# Patient Record
Sex: Female | Born: 2000 | Race: Black or African American | Hispanic: No | Marital: Single
Health system: Southern US, Community
[De-identification: ages and names within clinical notes are randomized; demographics above are authoritative.]

## PROBLEM LIST (undated history)

## (undated) DIAGNOSIS — N921 Excessive and frequent menstruation with irregular cycle: Secondary | ICD-10-CM

## (undated) DIAGNOSIS — Z9889 Other specified postprocedural states: Secondary | ICD-10-CM

---
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
## 2020-07-06 ENCOUNTER — Emergency Department (HOSPITAL_BASED_OUTPATIENT_CLINIC_OR_DEPARTMENT_OTHER): Payer: Medicaid - Out of State

## 2020-07-06 ENCOUNTER — Other Ambulatory Visit: Payer: Self-pay

## 2020-07-06 ENCOUNTER — Emergency Department (HOSPITAL_BASED_OUTPATIENT_CLINIC_OR_DEPARTMENT_OTHER)
Admission: EM | Admit: 2020-07-06 | Discharge: 2020-07-06 | Disposition: A | Discharge status: Home / Self Care | Payer: Medicaid - Out of State | Attending: Emergency Medicine | Admitting: Emergency Medicine

## 2020-07-06 DIAGNOSIS — R059 Cough, unspecified: Secondary | ICD-10-CM

## 2020-07-06 DIAGNOSIS — U071 COVID-19: Secondary | ICD-10-CM | POA: Diagnosis not present

## 2020-07-06 LAB — SARS CORONAVIRUS 2 (TAT 6-24 HRS): SARS Coronavirus 2: POSITIVE — AB

## 2020-07-06 MED ORDER — ACETAMINOPHEN 500 MG PO TABS
1000.0000 mg | ORAL_TABLET | Freq: Once | ORAL | Status: AC
  Administered 2020-07-06: 1000 mg via ORAL
  Filled 2020-07-06: qty 2

## 2020-07-06 NOTE — Discharge Instructions (Signed)
Chest x-ray showed no evidence of pneumonia or infection.  Overall suspect you have ongoing viral process.  Continue Tylenol and Motrin as needed for fever.  Please return if symptoms worsen.  Follow-up your COVID and influenza testing online.

## 2020-07-06 NOTE — ED Provider Notes (Signed)
[facility] EMERGENCY DEPARTMENT Provider Note   CSN: [id number] Arrival date & time: 07/06/20  1001     History Chief Complaint  Patient presents with  . Cough    Julie Nunez is a 20 y.o. female.  The history is provided by the patient.  Cough Cough characteristics:  Productive Sputum characteristics:  Nondescript Severity:  Mild Onset quality:  Gradual Duration:  2 weeks Timing:  Intermittent Progression:  Waxing and waning Chronicity:  New Relieved by:  Nothing Worsened by:  Nothing Associated symptoms: no chest pain, no chills, no ear pain, no fever, no rash, no shortness of breath and no sore throat        No past medical history on file.  There are no problems to display for this patient.    OB History   No obstetric history on file.     No family history on file.     Home Medications Prior to Admission medications   Medication Sig Start Date End Date Taking? Authorizing Provider  cetirizine (ZYRTEC) 10 MG tablet Take 10 mg by mouth daily.   Yes [provider]  fluticasone (FLONASE) 50 MCG/ACT nasal spray Place 1 spray into both nostrils daily.   Yes [provider]  medroxyPROGESTERone (DEPO-PROVERA) 150 MG/ML injection Inject 150 mg into the muscle every 3 (three) months.   Yes [provider]    Allergies    Patient has no known allergies.  Review of Systems   Review of Systems  Constitutional: Negative for chills and fever.  HENT: Negative for ear pain and sore throat.   Eyes: Negative for pain and visual disturbance.  Respiratory: Positive for cough. Negative for shortness of breath.   Cardiovascular: Negative for chest pain and palpitations.  Gastrointestinal: Negative for abdominal pain and vomiting.  Genitourinary: Negative for dysuria and hematuria.  Musculoskeletal: Negative for arthralgias and back pain.  Skin: Negative for color change and rash.  Neurological: Negative for seizures and  syncope.  All other systems reviewed and are negative.   Physical Exam Updated Vital Signs BP 123/78 (BP Location: Right Arm)   Pulse (!) 118   Temp 99.7 F (37.6 C) (Oral)   Resp 20   Ht 5\' 6"  (1.676 m)   Wt 89.8 kg   LMP 06/10/2020 (Exact Date)   SpO2 99%   BMI 31.96 kg/m   Physical Exam Vitals and nursing note reviewed.  Constitutional:      General: She is not in acute distress.    Appearance: She is well-developed. She is not ill-appearing.  HENT:     Head: Normocephalic and atraumatic.     Nose: Nose normal.     Mouth/Throat:     Mouth: Mucous membranes are moist.  Eyes:     Extraocular Movements: Extraocular movements intact.     Conjunctiva/sclera: Conjunctivae normal.     Pupils: Pupils are equal, round, and reactive to light.  Cardiovascular:     Rate and Rhythm: Normal rate and regular rhythm.     Pulses: Normal pulses.     Heart sounds: Normal heart sounds. No murmur heard.   Pulmonary:     Effort: Pulmonary effort is normal. No respiratory distress.     Breath sounds: Normal breath sounds.  Abdominal:     Palpations: Abdomen is soft.     Tenderness: There is no abdominal tenderness.  Musculoskeletal:     Cervical back: Normal range of motion and neck supple.  Skin:    General:  Skin is warm and dry.  Neurological:     Mental Status: She is alert.     ED Results / Procedures / Treatments   Labs (all labs ordered are listed, but only abnormal results are displayed) Labs Reviewed  SARS CORONAVIRUS 2 (TAT 6-24 HRS)    EKG None  Radiology DG Chest Portable 1 View  Result Date: 07/06/2020 CLINICAL DATA:  Cough. EXAM: PORTABLE CHEST 1 VIEW COMPARISON:  None. FINDINGS: The heart size and mediastinal contours are within normal limits. Both lungs are clear. The visualized skeletal structures are unremarkable. IMPRESSION: No active disease. Electronically Signed   By: Lupita Raider M.D.   On: 07/06/2020 10:59    Procedures Procedures    Medications Ordered in ED Medications  acetaminophen (TYLENOL) tablet 1,000 mg (1,000 mg Oral Given 07/06/20 1056)    ED Course  I have reviewed the triage vital signs and the nursing notes.  Pertinent labs & imaging results that were available during my care of the patient were reviewed by me and considered in my medical decision making (see chart for details).    MDM Rules/Calculators/A&P                          Julie Nunez is here with cough.  Overall unremarkable vitals.  Low-grade fever.  Mild tachycardia.  Has had a cough with some sputum production for the last week plus.  Had negative flu test 2 weeks ago.  Chest x-ray shows no evidence of pneumonia.  She has no signs of respiratory distress.  No hypoxia.  No concern for blood clot.  No concern for cardiac process.  Overall suspect viral process.  Will retest for COVID and influenza.  Discharged in good condition.  Understands return precautions.  This chart was dictated using voice recognition software.  Despite best efforts to proofread,  errors can occur which can change the documentation meaning.     Final Clinical Impression(s) / ED Diagnoses Final diagnoses:  Cough    Rx / DC Orders ED Discharge Orders    None       Virgina Norfolk, DO 07/06/20 1104

## 2020-07-06 NOTE — ED Triage Notes (Signed)
Persistent productive cough x2 weeks.

## 2020-07-11 ENCOUNTER — Emergency Department (HOSPITAL_BASED_OUTPATIENT_CLINIC_OR_DEPARTMENT_OTHER): Payer: Medicaid - Out of State

## 2020-07-11 ENCOUNTER — Emergency Department (HOSPITAL_BASED_OUTPATIENT_CLINIC_OR_DEPARTMENT_OTHER)
Admission: EM | Admit: 2020-07-11 | Discharge: 2020-07-11 | Disposition: A | Discharge status: Home / Self Care | Payer: Medicaid - Out of State | Attending: Emergency Medicine | Admitting: Emergency Medicine

## 2020-07-11 ENCOUNTER — Other Ambulatory Visit: Payer: Self-pay

## 2020-07-11 DIAGNOSIS — U071 COVID-19: Secondary | ICD-10-CM | POA: Diagnosis not present

## 2020-07-11 DIAGNOSIS — R059 Cough, unspecified: Secondary | ICD-10-CM | POA: Diagnosis present

## 2020-07-11 NOTE — ED Provider Notes (Signed)
[facility] EMERGENCY DEPARTMENT Provider Note   CSN: [id number] Arrival date & time: 07/11/20  1308     History Chief Complaint  Patient presents with  . 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 female.  HPI      Tested positive for COVID 4/29 Cough 2-3 weeks, congestion Coughing up yellow-clear mucus Chest pain, like pressure type feeling just when coughing, cough worse at night No shortness of breath No orthopnea No leg pain or swelling No hx of DVT/PE, is on ocp, no long trips car airplane, or recent surgeries No fever Here looking for work guidance given continued symptoms and when to return to work.  No past medical history on file.  There are no problems to display for this patient.    OB History   No obstetric history on file.     No family history on file.     Home Medications Prior to Admission medications   Medication Sig Start Date End Date Taking? Authorizing Provider  cetirizine (ZYRTEC) 10 MG tablet Take 10 mg by mouth daily.    [provider]  fluticasone (FLONASE) 50 MCG/ACT nasal spray Place 1 spray into both nostrils daily.    [provider]  medroxyPROGESTERone (DEPO-PROVERA) 150 MG/ML injection Inject 150 mg into the muscle every 3 (three) months.    [provider]    Allergies    Patient has no known allergies.  Review of Systems   Review of Systems  Constitutional: Positive for fatigue. Negative for fever.  HENT: Positive for congestion. Negative for sore throat.   Respiratory: Positive for cough. Negative for shortness of breath.   Cardiovascular: Positive for chest pain (with cough). Negative for leg swelling.  Gastrointestinal: Negative for abdominal pain, diarrhea, nausea and vomiting.  Musculoskeletal: Negative for back pain.  Skin: Negative for rash.  Neurological: Positive for headaches. Negative for light-headedness.     Physical Exam Updated Vital Signs BP 109/89 (BP Location: Right Arm)   Pulse 80   Temp 98.9 F (37.2 C) (Oral)   Resp 20   Ht 5\' 6"  (1.676 m)   Wt 89.8 kg   SpO2 99%   BMI 31.96 kg/m   Physical Exam Vitals and nursing note reviewed.  Constitutional:      General: She is not in acute distress.    Appearance: She is well-developed. She is not diaphoretic.  HENT:     Head: Normocephalic and atraumatic.  Eyes:     Conjunctiva/sclera: Conjunctivae normal.  Neck:     Comments: No JVD Cardiovascular:     Rate and Rhythm: Normal rate and regular rhythm.     Heart sounds: Normal heart sounds. No murmur heard. No friction rub. No gallop.   Pulmonary:     Effort: Pulmonary effort is normal. No respiratory distress.     Breath sounds: Normal breath sounds. No wheezing or rales.  Abdominal:     General: There is no distension.     Palpations: Abdomen is soft.     Tenderness: There is no abdominal tenderness. There is no guarding.  Musculoskeletal:        General: No tenderness.     Cervical back: Normal range of motion.  Skin:    General: Skin is warm and dry.     Findings: No erythema or rash.  Neurological:     Mental Status: She is alert and oriented to  person, place, and time.     ED Results / Procedures / Treatments   Labs (all labs ordered are listed, but only abnormal results are displayed) Labs Reviewed - No data to display  EKG None  Radiology No results found.  Procedures Procedures   Medications Ordered in ED Medications - No data to display  ED Course  I have reviewed the triage vital signs and the nursing notes.  Pertinent labs & imaging results that were available during my care of the patient were reviewed by me and considered in my medical decision making (see chart for details).    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  Cough has been present for approximately 2 weeks. No dyspnea, no asymmetric leg swelling, no tachycardia, no hypoxia or tachypnea and have low clinical suspicion for PE. Fever resolving days ago from initial infectious symptoms without new fever or worsening cough, no abnormalities on exam, and have low suspicion at this time for bacterial pneumonia. No findings on exam to suggest CHF.  Suspect symptoms are continuing cough from COVID 19 infection. Given continued symptoms and positive test only 5 days ago, recommend continued supportive care and quarantine.   Final Clinical Impression(s) / ED Diagnoses Final diagnoses:  COVID-19    Rx / DC Orders ED Discharge Orders    None       Alvira Monday, MD 07/12/20 [phone]

## 2020-07-11 NOTE — ED Triage Notes (Signed)
Julie Nunez wants to be retested to see if she is COVID negative.  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

## 2021-11-09 IMAGING — DX DG CHEST 1V PORT
1 series · 1 of 1 positions shown · non-contrast
Comparison: None.

CLINICAL DATA: Cough.

EXAM:
PORTABLE CHEST 1 VIEW

[chest ap]
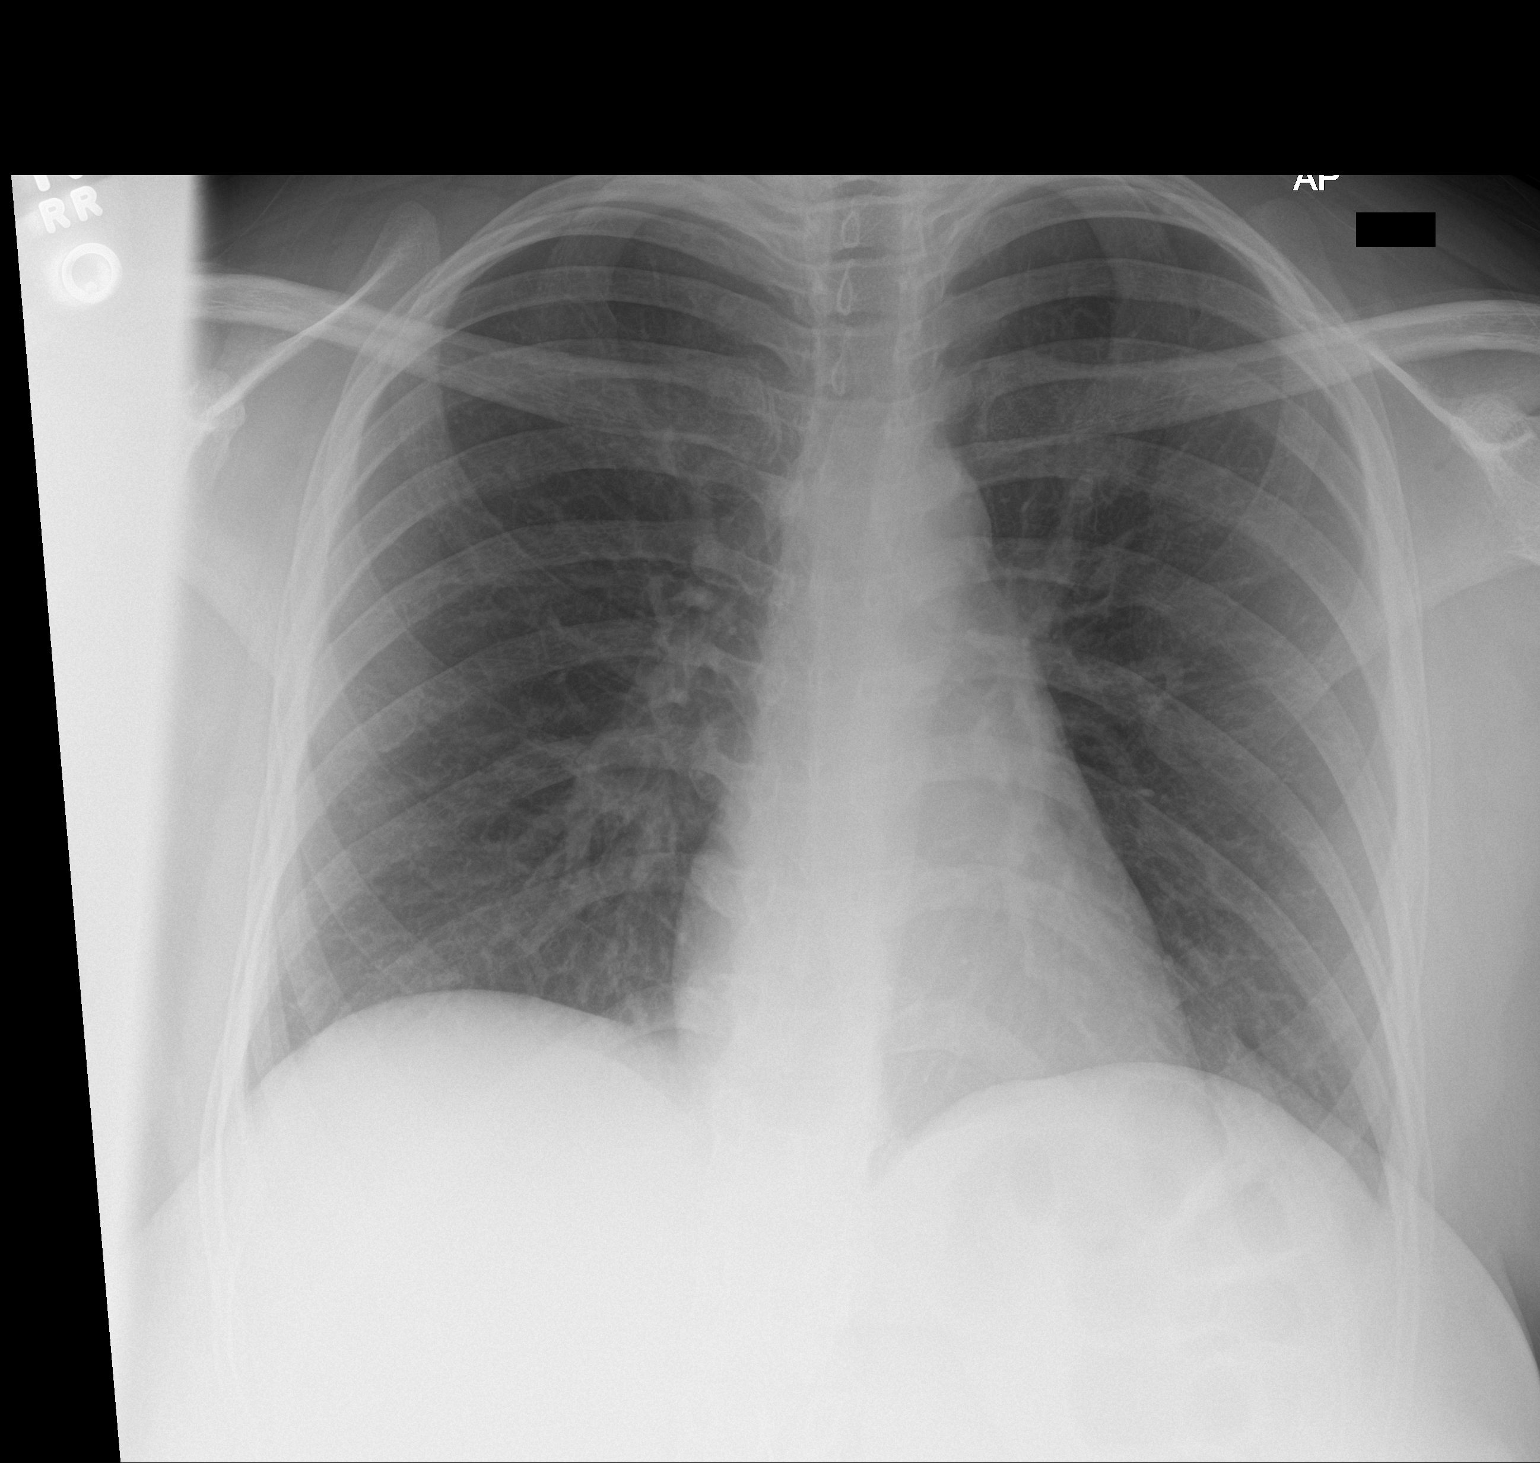

[1 of 1 positions shown; findings below may reference images not displayed]

FINDINGS: The heart size and mediastinal contours are within normal limits.
Both lungs are clear. The visualized skeletal structures are
unremarkable.
IMPRESSION: No active disease.

## 2022-02-14 ENCOUNTER — Inpatient Hospital Stay
Admit: 2022-02-14 | Discharge: 2022-02-14 | Disposition: A | Discharge status: Home / Self Care | Payer: PRIVATE HEALTH INSURANCE | Attending: Emergency Medicine

## 2022-02-14 DIAGNOSIS — N938 Other specified abnormal uterine and vaginal bleeding: Secondary | ICD-10-CM

## 2022-02-14 LAB — CBC WITH AUTO DIFFERENTIAL
Absolute Immature Granulocyte: 0 10*3/uL (ref 0.00–0.04)
Basophils %: 0 % (ref 0–2)
Basophils Absolute: 0 10*3/uL (ref 0.0–0.1)
Eosinophils %: 1 % (ref 0–5)
Eosinophils Absolute: 0.1 10*3/uL (ref 0.0–0.4)
Hematocrit: 32.2 % — ABNORMAL LOW (ref 35.0–45.0)
Hemoglobin: 10.5 g/dL — ABNORMAL LOW (ref 12.0–16.0)
Immature Granulocytes: 0 % (ref 0.0–0.5)
Lymphocytes %: 33 % (ref 21–52)
Lymphocytes Absolute: 3 10*3/uL (ref 0.9–3.6)
MCH: 28.8 PG (ref 24.0–34.0)
MCHC: 32.6 g/dL (ref 31.0–37.0)
MCV: 88.5 FL (ref 78.0–100.0)
MPV: 8.7 FL — ABNORMAL LOW (ref 9.2–11.8)
Monocytes %: 5 % (ref 3–10)
Monocytes Absolute: 0.4 10*3/uL (ref 0.05–1.2)
Neutrophils %: 61 % (ref 40–73)
Neutrophils Absolute: 5.5 10*3/uL (ref 1.8–8.0)
Nucleated RBCs: 0 PER 100 WBC
Platelets: 273 10*3/uL (ref 135–420)
RBC: 3.64 M/uL — ABNORMAL LOW (ref 4.20–5.30)
RDW: 13 % (ref 11.6–14.5)
WBC: 9.1 10*3/uL (ref 4.6–13.2)
nRBC: 0 10*3/uL (ref 0.00–0.01)

## 2022-02-14 LAB — COMPREHENSIVE METABOLIC PANEL
ALT: 23 U/L (ref 13–56)
AST: 15 U/L (ref 10–38)
Albumin/Globulin Ratio: 0.9 (ref 0.8–1.7)
Albumin: 3.5 g/dL (ref 3.4–5.0)
Alk Phosphatase: 86 U/L (ref 45–117)
Anion Gap: 6 mmol/L (ref 3.0–18)
BUN: 14 MG/DL (ref 7.0–18)
Bun/Cre Ratio: 18 (ref 12–20)
CO2: 30 mmol/L (ref 21–32)
Calcium: 8.9 MG/DL (ref 8.5–10.1)
Chloride: 108 mmol/L (ref 100–111)
Creatinine: 0.79 MG/DL (ref 0.6–1.3)
Est, Glom Filt Rate: >60 mL/min/{1.73_m2} (ref >60)
Globulin: 3.9 g/dL (ref 2.0–4.0)
Glucose: 78 mg/dL (ref 74–99)
Potassium: 3.9 mmol/L (ref 3.5–5.5)
Sodium: 144 mmol/L (ref 136–145)
Total Bilirubin: 0.1 MG/DL — ABNORMAL LOW (ref 0.2–1.0)
Total Protein: 7.4 g/dL (ref 6.4–8.2)

## 2022-02-14 LAB — URINALYSIS
Bilirubin Urine: NEGATIVE
Glucose, UA: NEGATIVE mg/dL
Ketones, Urine: NEGATIVE mg/dL
Leukocyte Esterase, Urine: NEGATIVE
Nitrite, Urine: NEGATIVE
Protein, UA: NEGATIVE mg/dL
Specific Gravity, UA: 1.013 (ref 1.005–1.030)
Urobilinogen, Urine: 0.2 EU/dL (ref 0.2–1.0)
pH, Urine: 7 (ref 5.0–8.0)

## 2022-02-14 LAB — URINALYSIS, MICRO: RBC, UA: 36 /hpf (ref 0–5)

## 2022-02-14 LAB — PREGNANCY, URINE: HCG(Urine) Pregnancy Test: NEGATIVE

## 2022-02-14 MED ORDER — NORETHINDRON-ETHINYL ESTRAD-FE 1-20/1-30/1-35 MG-MCG PO TABS
1-20/1-30/1-35 MG-MCG | PACK | ORAL | 11 refills | Status: AC
Start: 2022-02-14 — End: 2022-10-30

## 2022-02-14 NOTE — ED Triage Notes (Signed)
13 days of vaginal bleeding with large clots.   Intermittent pelvic and back cramping.

## 2022-02-14 NOTE — ED Provider Notes (Addendum)
[facility]  Emergency Department Treatment Report        Patient: Deborah Yoder Age: 21 y.o. Sex: female    Date of Birth: [birth year] Admit Date: 02/14/2022 PCP: Robyn Haber, MD   MRN: [id number]  CSN: [id number]     Room: HER10/10 Time Dictated: 12:22 PM            Chief Complaint   Chief Complaint   Patient presents with    Vaginal Bleeding       History of Present Illness   This is a 21 y.o. female no past medical history presents with vaginal bleeding.  Patient states that she has been bleeding for about 13 days.  Denies pregnancy notes that she has been right about 5 pads a day has had some lightheadedness.  Does not follow with therapy.  No fevers or chills no vaginal discharge.  No chest pain or shortness of breath    Review of Systems   Review of Systems   All other systems reviewed and are negative.        Past Medical/Surgical History     Past Medical History:   Diagnosis Date    Pericarditis     Strep throat      History reviewed. No pertinent surgical history.    Social History     Social History     Socioeconomic History    Marital status: Single     Spouse name: Not on file    Number of children: Not on file    Years of education: Not on file    Highest education level: Not on file   Occupational History    Not on file   Tobacco Use    Smoking status: Never    Smokeless tobacco: Never   Substance and Sexual Activity    Alcohol use: No    Drug use: No    Sexual activity: Not on file   Other Topics Concern    Not on file   Social History Narrative    Not on file     Social Determinants of Health     Financial Resource Strain: Not on file   Food Insecurity: Not on file   Transportation Needs: Not on file   Physical Activity: Not on file   Stress: Not on file   Social Connections: Not on file   Intimate Partner Violence: Not on file   Housing Stability: Not on file       Family History   History reviewed. No pertinent family history.    Current Medications     No current  facility-administered medications for this encounter.     Current Outpatient Medications   Medication Sig Dispense Refill    ibuprofen (ADVIL;MOTRIN) 600 MG tablet Take 1 tablet by mouth every 6 hours as needed for Pain      Norethindron-Ethinyl Estrad-Fe 1-20/1-30/1-35 MG-MCG TABS 35 mcg ethinyl estradiol-containing combination OC pill; five pills on day 1, four pills on day 2, three pills on day 3, two pills on day 4, and one pill on day 5 1 packet 11       Allergies   No Known Allergies    Physical Exam   Patient Vitals for the past 24 hrs:   Temp Pulse Resp BP SpO2   02/14/22 1035 98.4 F (36.9 C) 74 18 127/70 100 %     Physical Exam  Constitutional:       Appearance: Normal appearance.   HENT:  Head: Normocephalic.      Nose: Nose normal.      Mouth/Throat:      Mouth: Mucous membranes are moist.   Cardiovascular:      Rate and Rhythm: Normal rate.      Pulses: Normal pulses.   Pulmonary:      Effort: Pulmonary effort is normal.   Abdominal:      General: Abdomen is flat.   Musculoskeletal:         General: No swelling or deformity. Normal range of motion.      Cervical back: Normal range of motion.   Skin:     General: Skin is warm.      Coloration: Skin is not jaundiced.   Neurological:      General: No focal deficit present.      Mental Status: She is alert.      Cranial Nerves: No cranial nerve deficit.   Psychiatric:         Mood and Affect: Mood normal.         Thought Content: Thought content normal.           Impression and Management Plan   RECORDS REVIEWED:  I reviewed the patient's previous records here at [facility] and available outside facilities and note that follows predominantly with ER.  Used to see Dr. Justice Britain for [facility]    EXTERNAL RESULTS REVIEWED: I followed outpatient by OB no longer follows with him now    INDEPENDENT HISTORIAN:  History and/or plan development assisted by: Patient    Severe exacerbation or progression of chronic illness: Acute vaginal bleeding      Threat to body  function without evaluation and management: Loss of life or limb      SOCIAL DETERMINANTS  impacting Evaluation and Management:      Comorbidities impacting Evaluation and Management: None      Differential diagnoses vaginal bleeding retained products of conception miscarriage    Initial impression: Patient presents with vaginal bleeding check H&H to pelvic    Diagnostic Studies   Lab:   Results for orders placed or performed during the hospital encounter of 02/14/22   Urinalysis   Result Value Ref Range    Color, UA YELLOW      Appearance CLEAR      Specific Gravity, UA 1.013 1.005 - 1.030      pH, Urine 7.0 5.0 - 8.0      Protein, UA Negative NEG mg/dL    Glucose, UA Negative NEG mg/dL    Ketones, Urine Negative NEG mg/dL    Bilirubin Urine Negative NEG      Blood, Urine LARGE (A) NEG      Urobilinogen, Urine 0.2 0.2 - 1.0 EU/dL    Nitrite, Urine Negative NEG      Leukocyte Esterase, Urine Negative NEG     Pregnancy, Urine   Result Value Ref Range    HCG(Urine) Pregnancy Test Negative NEG     CBC with Auto Differential   Result Value Ref Range    WBC 9.1 4.6 - 13.2 K/uL    RBC 3.64 (L) 4.20 - 5.30 M/uL    Hemoglobin 10.5 (L) 12.0 - 16.0 g/dL    Hematocrit 32.2 (L) 35.0 - 45.0 %    MCV 88.5 78.0 - 100.0 FL    MCH 28.8 24.0 - 34.0 PG    MCHC 32.6 31.0 - 37.0 g/dL    RDW 13.0 11.6 - 14.5 %    Platelets  273 135 - 420 K/uL    MPV 8.7 (L) 9.2 - 11.8 FL    Nucleated RBCs 0.0 0 PER 100 WBC    nRBC 0.00 0.00 - 0.01 K/uL    Neutrophils % 61 40 - 73 %    Lymphocytes % 33 21 - 52 %    Monocytes % 5 3 - 10 %    Eosinophils % 1 0 - 5 %    Basophils % 0 0 - 2 %    Immature Granulocytes 0 0.0 - 0.5 %    Neutrophils Absolute 5.5 1.8 - 8.0 K/UL    Lymphocytes Absolute 3.0 0.9 - 3.6 K/UL    Monocytes Absolute 0.4 0.05 - 1.2 K/UL    Eosinophils Absolute 0.1 0.0 - 0.4 K/UL    Basophils Absolute 0.0 0.0 - 0.1 K/UL    Absolute Immature Granulocyte 0.0 0.00 - 0.04 K/UL    Differential Type AUTOMATED     CMP   Result Value Ref Range     Sodium 144 136 - 145 mmol/L    Potassium 3.9 3.5 - 5.5 mmol/L    Chloride 108 100 - 111 mmol/L    CO2 30 21 - 32 mmol/L    Anion Gap 6 3.0 - 18 mmol/L    Glucose 78 74 - 99 mg/dL    BUN 14 7.0 - 18 MG/DL    Creatinine 0.79 0.6 - 1.3 MG/DL    Bun/Cre Ratio 18 12 - 20      Est, Glom Filt Rate >60 >60 ml/min/1.12m    Calcium 8.9 8.5 - 10.1 MG/DL    Total Bilirubin 0.1 (L) 0.2 - 1.0 MG/DL    ALT 23 13 - 56 U/L    AST 15 10 - 38 U/L    Alk Phosphatase 86 45 - 117 U/L    Total Protein 7.4 6.4 - 8.2 g/dL    Albumin 3.5 3.4 - 5.0 g/dL    Globulin 3.9 2.0 - 4.0 g/dL    Albumin/Globulin Ratio 0.9 0.8 - 1.7     Urinalysis, Micro   Result Value Ref Range    WBC, UA NONE 0 - 4 /hpf    RBC, UA 36 to 50 0 - 5 /hpf    Epithelial Cells UA 1+ 0 - 5 /lpf     Imaging:    No orders to display       Medical unremarkable.  Patient has no history of blood clots PE DVT she does not smoke no history of headaches.  Has been on birth control in the past successfully.  Will prescribe a round of birth control to stop the bleeding.  Discussed this with her she may or may not take it she is yet to decide    .  Bedside vaginal exam done with nursing assisting.  Small amount of bleeding.  No CMT adnexal tenderness.  ED Course         Critical Care Time (if necessary)     Medications - No data to display        NARRATIVE:  Patient with dysfunctional uterine bleeding.  Blood works unremarkable will discharge for outpatient follow-up with OB      Medical Decision Making   As above         Final Diagnosis       ICD-10-CM    1. DUB (dysfunctional uterine bleeding)  N93.8           Received a call from  the pharmacy questioning the prescription.  I did verify this with our internal pharmacy the prescription is accurate.    Disposition   dc      Magda Kiel, MD  February 14, 2022    My signature above authenticates this document and my orders, the final    diagnosis (es), discharge prescription (s), and instructions in the Epic    record.       Nursing  notes have been reviewed by the physician/ advanced practice    Clinician.       Jacinto Reap, MD  02/14/22 1222       Jacinto Reap, MD  02/14/22 [phone]

## 2022-02-14 NOTE — ED Notes (Signed)
Pelvic exam with speculum and bi manual completed by provider with this RN as Environmental consultant. No specimens obtained. Patient tolerated well.      Earney Mallet, RN  02/14/22 1220

## 2022-07-17 NOTE — Telephone Encounter (Signed)
Formatting of this note might be different from the original.  Patient informed of no show and reschedule policy of 24 hr notice and $50 fee.   Electronically signed by Etta Grandchild at 07/17/2022  1:32 PM EDT

## 2022-07-26 ENCOUNTER — Emergency Department: Admit: 2022-07-27 | Payer: MEDICAID | Primary: Pediatrics

## 2022-07-26 DIAGNOSIS — B349 Viral infection, unspecified: Secondary | ICD-10-CM

## 2022-07-26 NOTE — ED Notes (Signed)
Ok per MD to discharge with HR and temp. Discharge teaching provided to pt regarding treatment received, medications prescribed, and follow-up care. Pt verbalized understanding directions and follow up care. Pt left ambulatory with discharge paperwork in hand.

## 2022-07-26 NOTE — Discharge Instructions (Signed)
Continue with Tylenol and Motrin at home for the fevers and aches.  May take any decongestant such as Mucinex at home for congestion.  Follow-up with your primary care doctor.  Return to the emergency department for any new or worsening symptoms.

## 2022-07-26 NOTE — ED Triage Notes (Signed)
Ambulatory pt c/o pressure in head since 10am. Endorses fatigue and feeling cold even while under blankets. Pt reports being diagnosed with anemia and vitamin D deficiency recently

## 2022-07-26 NOTE — ED Provider Notes (Signed)
EMERGENCY DEPARTMENT HISTORY AND PHYSICAL EXAM      Date: 07/26/2022  Patient Name: Deborah Yoder    History of Presenting Illness     Chief Complaint   Patient presents with    Headache       Patient is a 22 year old female presenting to the emergency department for evaluation of complaints of sinus pressure with associated myalgias, fevers and chills and fatigue.  Patient states that she was recently diagnosed with anemia and vitamin D deficiency and was started on iron pills and vitamin D pills.  She denies any sick contacts.  She denies any chest pain, shortness of breath, abdominal pain, NVD.          PCP: Su Monks, MD    No current facility-administered medications for this encounter.     Current Outpatient Medications   Medication Sig Dispense Refill    medroxyPROGESTERone (PROVERA) 10 MG tablet Take 1 tablet by mouth daily with food      metroNIDAZOLE (FLAGYL) 500 MG tablet 1 tablet Orally Twice a day for 7 day(s)      vitamin D (ERGOCALCIFEROL) 1.25 MG (50000 UT) CAPS capsule TAKE 1 CAPSULE BY MOUTH TWICE A WEEK FOR 8 WEEKS      ibuprofen (ADVIL;MOTRIN) 600 MG tablet Take 1 tablet by mouth every 6 hours as needed for Pain      Norethindron-Ethinyl Estrad-Fe 1-20/1-30/1-35 MG-MCG TABS 35 mcg ethinyl estradiol-containing combination OC pill; five pills on day 1, four pills on day 2, three pills on day 3, two pills on day 4, and one pill on day 5 1 packet 11       Past History     Past Medical History:  Past Medical History:   Diagnosis Date    Anemia     Pericarditis     Strep throat     Vitamin D deficiency        Past Surgical History:  History reviewed. No pertinent surgical history.    Family History:  History reviewed. No pertinent family history.    Social History:  Social History     Tobacco Use    Smoking status: Never    Smokeless tobacco: Never   Substance Use Topics    Alcohol use: No    Drug use: No       Allergies:  No Known Allergies      Review of Systems       Review of Systems    Constitutional:  Positive for fatigue and fever. Negative for activity change.   HENT:  Positive for congestion, sinus pressure and sinus pain. Negative for dental problem, drooling, facial swelling, rhinorrhea, sore throat and trouble swallowing.    Respiratory:  Negative for cough, chest tightness, shortness of breath and stridor.    Cardiovascular:  Negative for chest pain.   Gastrointestinal:  Negative for abdominal pain, diarrhea, nausea and vomiting.   Musculoskeletal:  Positive for myalgias. Negative for arthralgias.   Skin:  Negative for rash and wound.   Neurological:  Negative for dizziness, weakness, light-headedness, numbness and headaches.   Psychiatric/Behavioral:  Negative for agitation.          Physical Exam   BP 130/71   Pulse (!) 111   Temp (!) 100.8 F (38.2 C) (Oral)   Resp 17   Ht 1.702 m (5\' 7" )   Wt 110.2 kg (243 lb)   SpO2 100%   BMI 38.06 kg/m       Physical Exam  Constitutional:       General: She is not in acute distress.     Appearance: She is not ill-appearing.   HENT:      Head: Normocephalic and atraumatic.      Right Ear: Tympanic membrane normal.      Left Ear: Tympanic membrane normal.      Nose: Congestion and rhinorrhea present.      Mouth/Throat:      Mouth: Mucous membranes are moist.      Pharynx: No oropharyngeal exudate or posterior oropharyngeal erythema.      Comments: Tenderness to palpation to maxillary region  Eyes:      Extraocular Movements: Extraocular movements intact.      Pupils: Pupils are equal, round, and reactive to light.   Cardiovascular:      Rate and Rhythm: Normal rate and regular rhythm.   Pulmonary:      Effort: Pulmonary effort is normal. No respiratory distress.      Breath sounds: Normal breath sounds. No stridor. No wheezing, rhonchi or rales.   Chest:      Chest wall: No tenderness.   Abdominal:      General: Abdomen is flat.      Palpations: Abdomen is soft.      Tenderness: There is no abdominal tenderness.   Musculoskeletal:          General: No swelling or deformity. Normal range of motion.      Cervical back: Normal range of motion and neck supple. No rigidity or tenderness.   Skin:     General: Skin is warm and dry.      Capillary Refill: Capillary refill takes less than 2 seconds.   Neurological:      General: No focal deficit present.      Mental Status: She is alert and oriented to person, place, and time.      Cranial Nerves: No cranial nerve deficit.      Sensory: No sensory deficit.      Motor: No weakness.   Psychiatric:         Mood and Affect: Mood normal.           Diagnostic Study Results     Labs -  Recent Results (from the past 12 hour(s))   CBC with Auto Differential    Collection Time: 07/26/22  8:47 PM   Result Value Ref Range    WBC 13.7 (H) 4.6 - 13.2 K/uL    RBC 3.86 (L) 4.20 - 5.30 M/uL    Hemoglobin 9.9 (L) 12.0 - 16.0 g/dL    Hematocrit 16.1 (L) 35.0 - 45.0 %    MCV 81.3 78.0 - 100.0 FL    MCH 25.6 24.0 - 34.0 PG    MCHC 31.5 31.0 - 37.0 g/dL    RDW 09.6 (H) 04.5 - 14.5 %    Platelets 451 (H) 135 - 420 K/uL    MPV 8.7 (L) 9.2 - 11.8 FL    Nucleated RBCs 0.0 0 PER 100 WBC    nRBC 0.00 0.00 - 0.01 K/uL    Neutrophils % 85 (H) 40 - 73 %    Lymphocytes % 9 (L) 21 - 52 %    Monocytes % 4 3 - 10 %    Eosinophils % 1 0 - 5 %    Basophils % 0 0 - 2 %    Immature Granulocytes % 0 0.0 - 0.5 %    Neutrophils Absolute 11.7 (H) 1.8 -  8.0 K/UL    Lymphocytes Absolute 1.2 0.9 - 3.6 K/UL    Monocytes Absolute 0.5 0.05 - 1.2 K/UL    Eosinophils Absolute 0.1 0.0 - 0.4 K/UL    Basophils Absolute 0.0 0.0 - 0.1 K/UL    Immature Granulocytes Absolute 0.1 (H) 0.00 - 0.04 K/UL    Differential Type AUTOMATED     Basic Metabolic Panel    Collection Time: 07/26/22  8:47 PM   Result Value Ref Range    Sodium 138 136 - 145 mmol/L    Potassium 3.7 3.5 - 5.5 mmol/L    Chloride 107 100 - 111 mmol/L    CO2 24 21 - 32 mmol/L    Anion Gap 7 3.0 - 18 mmol/L    Glucose 124 (H) 74 - 99 mg/dL    BUN 8 7.0 - 18 MG/DL    Creatinine 2.95 0.6 - 1.3 MG/DL     BUN/Creatinine Ratio 9 (L) 12 - 20      Est, Glom Filt Rate >90 >60 ml/min/1.29m2    Calcium 9.2 8.5 - 10.1 MG/DL   AOZHY-86, Rapid    Collection Time: 07/26/22  8:47 PM    Specimen: Nasopharyngeal   Result Value Ref Range    Source Nasopharyngeal      SARS-CoV-2, Rapid Not detected NOTD     HCG Qualitative, Serum    Collection Time: 07/26/22  8:47 PM   Result Value Ref Range    Preg, Serum Negative NEG     Rapid influenza A/B antigens    Collection Time: 07/26/22  8:47 PM    Specimen: Nasal Washing   Result Value Ref Range    Influenza A Ag Negative NEG      Influenza B Ag Negative NEG     Urinalysis    Collection Time: 07/26/22  9:30 PM   Result Value Ref Range    Color, UA YELLOW      Appearance CLEAR      Specific Gravity, UA 1.025 1.005 - 1.030      pH, Urine 6.0 5.0 - 8.0      Protein, UA Negative NEG mg/dL    Glucose, Ur Negative NEG mg/dL    Ketones, Urine TRACE (A) NEG mg/dL    Bilirubin, Urine Negative NEG      Blood, Urine Negative NEG      Urobilinogen, Urine 1.0 0.2 - 1.0 EU/dL    Nitrite, Urine Negative NEG      Leukocyte Esterase, Urine SMALL (A) NEG     Urinalysis, Micro    Collection Time: 07/26/22  9:30 PM   Result Value Ref Range    WBC, UA 0 to 3 0 - 4 /hpf    Epithelial Cells, UA 1+ 0 - 5 /lpf       Radiologic Studies -   Non-plain film images such as CT, Ultrasound and MRI are read by the radiologist. Plain radiographic images are visualized and preliminarily interpreted by the emergency physician.    XR CHEST 1 VIEW   Final Result   :      1.  No acute cardiopulmonary disease.               Medical Decision Making   I am the first provider for this patient.    I reviewed the vital signs, available nursing notes, past medical history, past surgical history, family history and social history.      Vital Signs-Reviewed the patient's vital signs.  EKG: All EKG's are interpreted by the Emergency Department Physician who either signs or Co-signs this chart in the absence of a cardiologist.                Interpretation per the Radiologist below, if available at the time of this note:    ED Course: Progress Notes, Reevaluation, and Consults:    Provider Notes (Medical Decision Making):       MDM  Number of Diagnoses or Management Options  Viral illness  Diagnosis management comments: Patient in no acute distress.  She is febrile with a temperature of 102.4 and tachycardic heart rate of 130.  Saturating 100% on room air.  Blood pressure is normotensive.  Patient's symptoms are likely due to viral illness.  I do not think that sepsis workup needs to be initiated at this time.  IV fluids and Tylenol administered.  COVID and flu swabs obtained are negative.  Screening labs are grossly unremarkable.  Chest x-ray showing no acute evidence of pneumonia.  No UTI.  Her temperature and tachycardia are both improving.  Plan on discharge.  Instructed patient on symptomatic management at home.  Follow-up and return precautions advised.  Patient verbalizes understanding agreement with plan.  Stable for discharge.                Procedures          Diagnosis     Clinical Impression:   1. Viral illness        Disposition: discharged    Disclaimer: Sections of this note are dictated using utilizing voice recognition software.  Minor typographical errors may be present. If questions arise, please do not hesitate to contact me or call our department.          Sherre Scarlet, DO  07/26/22 2305

## 2022-07-27 ENCOUNTER — Inpatient Hospital Stay
Admit: 2022-07-27 | Discharge: 2022-07-27 | Disposition: A | Discharge status: Home / Self Care | Payer: MEDICAID | Attending: Emergency Medicine

## 2022-07-27 LAB — CBC WITH AUTO DIFFERENTIAL
Basophils %: 0 % (ref 0–2)
Basophils Absolute: 0 10*3/uL (ref 0.0–0.1)
Eosinophils %: 1 % (ref 0–5)
Eosinophils Absolute: 0.1 10*3/uL (ref 0.0–0.4)
Hematocrit: 31.4 % — ABNORMAL LOW (ref 35.0–45.0)
Hemoglobin: 9.9 g/dL — ABNORMAL LOW (ref 12.0–16.0)
Immature Granulocytes %: 0 % (ref 0.0–0.5)
Immature Granulocytes Absolute: 0.1 10*3/uL — ABNORMAL HIGH (ref 0.00–0.04)
Lymphocytes %: 9 % — ABNORMAL LOW (ref 21–52)
Lymphocytes Absolute: 1.2 10*3/uL (ref 0.9–3.6)
MCH: 25.6 PG (ref 24.0–34.0)
MCHC: 31.5 g/dL (ref 31.0–37.0)
MCV: 81.3 FL (ref 78.0–100.0)
MPV: 8.7 FL — ABNORMAL LOW (ref 9.2–11.8)
Monocytes %: 4 % (ref 3–10)
Monocytes Absolute: 0.5 10*3/uL (ref 0.05–1.2)
Neutrophils %: 85 % — ABNORMAL HIGH (ref 40–73)
Neutrophils Absolute: 11.7 10*3/uL — ABNORMAL HIGH (ref 1.8–8.0)
Nucleated RBCs: 0 PER 100 WBC
Platelets: 451 10*3/uL — ABNORMAL HIGH (ref 135–420)
RBC: 3.86 M/uL — ABNORMAL LOW (ref 4.20–5.30)
RDW: 19.2 % — ABNORMAL HIGH (ref 11.6–14.5)
WBC: 13.7 10*3/uL — ABNORMAL HIGH (ref 4.6–13.2)
nRBC: 0 10*3/uL (ref 0.00–0.01)

## 2022-07-27 LAB — BASIC METABOLIC PANEL
Anion Gap: 7 mmol/L (ref 3.0–18)
BUN/Creatinine Ratio: 9 — ABNORMAL LOW (ref 12–20)
BUN: 8 MG/DL (ref 7.0–18)
CO2: 24 mmol/L (ref 21–32)
Calcium: 9.2 MG/DL (ref 8.5–10.1)
Chloride: 107 mmol/L (ref 100–111)
Creatinine: 0.9 MG/DL (ref 0.6–1.3)
Est, Glom Filt Rate: >90 mL/min/{1.73_m2} (ref >60)
Glucose: 124 mg/dL — ABNORMAL HIGH (ref 74–99)
Potassium: 3.7 mmol/L (ref 3.5–5.5)
Sodium: 138 mmol/L (ref 136–145)

## 2022-07-27 LAB — URINALYSIS
Bilirubin, Urine: NEGATIVE
Blood, Urine: NEGATIVE
Glucose, Ur: NEGATIVE mg/dL
Nitrite, Urine: NEGATIVE
Protein, UA: NEGATIVE mg/dL
Specific Gravity, UA: 1.025 (ref 1.005–1.030)
Urobilinogen, Urine: 1 EU/dL (ref 0.2–1.0)
pH, Urine: 6 (ref 5.0–8.0)

## 2022-07-27 LAB — RAPID INFLUENZA A/B ANTIGENS
Influenza A Ag: NEGATIVE
Influenza B Ag: NEGATIVE

## 2022-07-27 LAB — COVID-19, RAPID: SARS-CoV-2, Rapid: NOT DETECTED

## 2022-07-27 LAB — URINALYSIS, MICRO: WBC, UA: 0 /hpf (ref 0–4)

## 2022-07-27 LAB — HCG, SERUM, QUALITATIVE: Preg, Serum: NEGATIVE

## 2022-07-27 MED ORDER — ACETAMINOPHEN 325 MG PO TABS
325 MG | Freq: Once | ORAL | Status: AC
Start: 2022-07-27 — End: 2022-07-26
  Administered 2022-07-27: 02:00:00 650 mg via ORAL

## 2022-07-27 MED ORDER — SODIUM CHLORIDE 0.9 % IV BOLUS
0.9 % | Freq: Once | INTRAVENOUS | Status: AC
Start: 2022-07-27 — End: 2022-07-26
  Administered 2022-07-27: 01:00:00 1000 mL via INTRAVENOUS

## 2022-07-27 MED ORDER — KETOROLAC TROMETHAMINE 15 MG/ML IJ SOLN
15 MG/ML | INTRAMUSCULAR | Status: AC
Start: 2022-07-27 — End: 2022-07-26
  Administered 2022-07-27: 01:00:00 15 mg via INTRAMUSCULAR

## 2022-07-27 MED FILL — KETOROLAC TROMETHAMINE 15 MG/ML IJ SOLN: 15 MG/ML | INTRAMUSCULAR | Qty: 1

## 2022-07-27 MED FILL — SODIUM CHLORIDE 0.9 % IV SOLN: 0.9 % | INTRAVENOUS | Qty: 1000

## 2022-07-27 MED FILL — ACETAMINOPHEN 325 MG PO TABS: 325 MG | ORAL | Qty: 2

## 2022-09-12 NOTE — Progress Notes (Signed)
INFUSION CLINICAL MANAGEMENT:    I provided infusion clinical management.  Muhammad Masab, MD

## 2022-10-21 NOTE — Other (Signed)
Pt tested positive for covid on aug 4th symptoms Headache and stuffy nose.  I spoke with anesthesia regarding protocol for surgery.  Waiting a call back with instructions. [facility] called back and 2 weeks post neg. Test is protocol  I will call Dr. Jean Rosenthal nurse and let her know and the pt.

## 2022-10-30 NOTE — Progress Notes (Signed)
PREOPERATIVE INSTRUCTIONS  Please Read Carefully    [x]  Your procedure is scheduled on: 11/03/22     [x]  The day before your surgery, call the surgeon's office to check on the surgery time and the time you should arrive.     [x]  On the day of your surgery, arrive at the time given to you by your surgeon and check in at the first floor registration desk.    [x]  Standard NPO Guidelines per Anesthesia:  [x]  DO NOT eat anything after midnight before your surgery. This includes gum, mints or hard candy.    [x]  May have water, black coffee (NO CREAM) or regular Gatorade (not sugar free) up to 3 hours prior to surgery start time, but no more than 6 ounces per hour (approx.  cup).    []  GLP-1 Medication NPO instructions per Anesthesia:  []  DO NOT eat anything after 5:00pm the night before your surgery. This includes gum, mints or hard candy.      []  May have Sips of water, black coffee (NO CREAM) or regular Gatorade (not sugar free) from 5:00pm to Midnight the night before your surgery.     []  DO NOT eat or drink anything after Midnight the night before your surgery.    []  Cardiac surgery NPO instructions per Anesthesia:  []  Please consume your Ensure PreSurgery drink no later than two hours prior to surgery start time.      []  Entire drink should be consumed within 5-10 minutes of starting.      [x]  You may brush your teeth the morning of surgery, however DO NOT swallow any water.    []  No smoking after midnight.  Smoking should be reduced a few days prior to surgery.    [x]  If you are having an outpatient procedure, you must have a responsible adult bring you to the hospital.  They must remain in the surgical waiting area for the duration of your procedure and provide you with transportation home.  You may not drive yourself home.  We also recommend that you arrange for a responsible person to stay with you for 24 hours following your procedure.  Failure to comply with these instructions may result in  cancellation of the procedure.    []  A parent or legal guardian must accompany minors (under the age of 89) to the hospital.  LEGAL GUARDIANS MUST bring custody papers with them the day of surgery.  A parent/legal guardian will be required to remain at the hospital throughout the minor's duration of stay--To include over night, if minor is admitted to the hospital.      []  If the lab gives you a blue blood ID band, do not throw it away.  Bring it with you on the day of surgery.      []  Please return to preop surgical testing no more than 14 days before surgery date to have your type and screen done prior to surgery.    []  If you have been diagnosed with Sleep Apnea and are using a CPAP, BiPAP, and/or Bi-Flex machine, please bring it with you the day of surgery.    []  Endoscopy patients, please follow the instructions provided by the doctors office.    [] If crutches are ordered, you must get them and be instructed on proper use before the day of surgery.  Please bring them with you the day of surgery.      PREPARING THE SKIN BEFORE SURGERY Can Reduce the Risk of Infection  []   You have been given a Chlorhexidine skin preparation packet with instructions to bathe the night before surgery and the morning of surgery prior to arrival.    [x]  If allergic to Chlorhexidine, use Dial (antibacterial) soap to bathe your skin the night before surgery and the morning of surgery.    [x]  Do not shave your face, underarms, legs or any part of your body at least 48 hours before surgery.  Any clipping needed for surgery will be done at the hospital.      PREPARING TO COME TO THE HOSPITAL  [x]  Wear casual, loose fitting comfortable clothes the day of surgery.    [x]  Remove ALL jewelry, including body piercings (failure to remove jewelry/piercings/dermal implants prior to surgery may contribute to electrical burns, infections, skin tears, airway obstruction, loss of circulation/oxygenation).  Leave jewelry and all valuables at home.   Leave suitcases and/or overnight bag at home or in the car for a family member to bring when you get a room.    [x]  DO NOT wear any makeup, deodorant, body lotion, aftershave or contact lenses the day of surgery.   Please bring your glasses and glasses case with you the day of surgery.    [x]  DO NOT wear any fingernail polish, artificial fingernails, or fingernail decoration (nothing other than your natural nails).  The fingernail is used to measure oxygen delivery to the body, these items inhibit the ability to measure this--surgery cannot be performed without the ability to accurately measure oxygen delivery to the body.    []  Bring any additional paperwork, such as doctors orders, consent form, POA (power of attorney), and/or advanced directives with you the day of surgery.    [x]  Please notify your surgeon of any changes in your condition such as fever, sore throat or rash as soon as possible before your surgery date.  After office hours, call your surgeon's answering service.    [x] Bring picture identification and insurance cards with you the day of surgery.      MEDICATIONS:  [x]  Stop taking aspirin and aspirin products/NSAIDs (non-steroidal anti-inflammatory drugs such as Motrin, Aleve, Advil, ibuprofen and BC Powder), vitamins and herbal pills 2 weeks before surgery OR as instructed by your doctor.  However, if you take a daily aspirin, please contact your primary care provider (PCP), for instructions prior to surgery.    []  Contact your doctor regarding any blood thinner medications you take (such as Coumadin, Plavix, etc.) for instructions about what to do prior to surgery.    [] If you have diabetes, hold oral diabetic medications the night before surgery and the morning of surgery.  Hold insulin the morning of surgery unless told otherwise by your doctor.    []  If you have asthma or use inhalers please bring them the day of surgery.    [x] Take the following medications (oral medications with a sip of  water)  the day of surgery:           If needed:            Prior to Visit Medications    Medication Sig Taking? Authorizing Provider   fluticasone (FLONASE) 50 MCG/ACT nasal spray 2 sprays by Nasal route daily Yes [provider]   medroxyPROGESTERone (PROVERA) 10 MG tablet Take 1 tablet by mouth daily with food  [provider]   ibuprofen (ADVIL;MOTRIN) 600 MG tablet Take 1 tablet by mouth every 6 hours as needed for Pain  [provider]        *  Visitor Policy-Surgical/Procedural Areas: Two people may escort the patient to the department's waiting room and may stay there during the procedure.  One visitor must be 78 years of age or older and the patient must arrive with their post procedure transportation home before procedure start time. The post-procedure transporting party must remain within the surgical waiting room for the duration of the procedure. Failure may result in the cancellation of the procedure*  Surgical Admitting Unit (SAU)/Phase II/Post Anesthesia Care Unit (PACU): Patients are allowed one visitor at a time. Visitors must be 16 or older. Note: If the patient's time in PACU is expected to be less than one hour, visitation may occur in Phase II Recovery or the patient's room. Visitation is generally limited to 5 - 10 minutes. Pediatric patients are allowed two visitors to accommodate both parents if the patient is hemodynamically stable.    We want you to have a positive experience at [facility].  If any of these these instructions are not met, it is possible that your surgery will be canceled.  If you have any questions regarding your surgery, please call [facility] at [phone] or your surgeon's office for further assistance.           [facility]    [facility]     There are many ways to perform anesthesia for surgery.  The 2 techniques are generally classified as General Anesthesia and Regional Anesthesia.  While both techniques are  very safe, they are distinctly different.  As with any anesthesia, there are risks, which may be increased if you already have heart disease, chronic lung conditions, or other serious medical problems.    General anesthesia puts you to sleep for your surgery.  It acts on your brain and nerves, and affects your entire body.  It may be administered by an injection, or through inhaling medication.  After you are asleep, a breathing tube may be placed in your windpipe to help you breathe during surgery.  General anesthesia may be more appropriate for longer or more involved surgery, especially if the position you will be in surgery is uncomfortable.  With general anesthesia you may experience a sore throat and hoarse voice for a few days, headache, nausea/vomiting, drowsiness, or blood pressure and breathing problems.    Regional anesthesia involves blocking the nerves to a specific area of the body with local anesthesia medication ("numbing medicine").  It is usually given in conjunction with varying degrees of twilight sedation.  When at all possible, the recommended type of anesthesia for both total knee replacement (TKA) and total hip replacement (THA) is a regional anesthesia technique.  This can be achieved utilizing a spinal block technique, or third placement of an epidural catheter.  In addition, your anesthesiologist may recommend that you have a peripheral nerve block placed to help with pain after the procedure.    -Spinal block-In a spinal block, local anesthetic (i.e. numbing medicine) is injected into the fluid that baths the spinal cord in the lower part of your back.  This produces a rapid numbing effect that wears off in a few hours.  After meeting the anesthesiologist and discussing her medical conditions and history, the anesthesiologist may decide to place a long-acting pain medicine as well.  There are some medical conditions and home medications that may preclude a spinal block.    -Epidural  block-An epidural block uses a catheter inserted into your lower back to deliver numbing medicine.  The epidural block in  the spinal block are administered in a very similar location and technique; however, the epidural catheter is placed in a slightly different area around the spine as compared to a spinal block.    -Peripheral nerve block (PNB)-For TKA, the anesthesiologist may discuss performing a PNB.  This technique places local anesthetic directly around the major nerves in your thigh, and one or multiple locations.  These blocks numb only the leg that is injected, and do not affect the other leg.  PNB's are used in addition to another anesthesia technique, and are for postoperative pain relief only.    The advantages of regional anesthesia for joint replacement surgery are considered to be significant.  There is a significant reduction in blood loss and blood transfusions, less nausea/vomiting, less drowsiness, improved pain control after surgery, better diabetes control, and less association with infection.  Additionally, there also are reduced risks (as compared to general anesthesia techniques) with serious medical complications such as heart attack, stroke, pneumonia, respiratory depression, or development of a blood clot in your legs that can go to your lungs.  Like with any technique, there are risks.  With regional anesthesia, there is a low incidence of headache and nerve damage.  Most commonly though, if the regional technique proves challenging, it is a difficulty in getting the numbing medicine in close proximity to the nerves perform the nerve block.  Extremely rarely (1 in 200,000), there can be a collection of blood from around the nerves.  Some patients may experience difficulty voiding (passing urine) for a period of time after a regional technique.

## 2022-11-02 NOTE — H&P (Signed)
History and Physical    Name: Deborah Yoder MRN: [id number] SSN: [id number]    Date of Birth: 21-Sep-2000  Age: 22 y.o.  Sex: female       Subjective:      Chief complaint:  Abnormal uterine bleeding    Nadeya is a 22 y.o. female with a history of abnormal uterine bleeding. US revealed 8.5cm uterus with 17mm lining and two possible polyps. Previous treatment measures included observation. She is admitted for Procedure(s) (LRB):  HYSTEROSCOPY; DILATION & CURETTAGE (N/A).    The current method of family planning is none.    Pt denies fever, chills, HA, CP, SOB, N/V.     OB History       Gravida   0    Para   0    Term   0    Preterm   0    AB   0    Living   0         SAB   0    IAB   0    Ectopic   0    Molar   0    Multiple   0    Live Births   0              Past Medical History:   Diagnosis Date    Anemia     Pericarditis     Strep throat     Vitamin D deficiency      Past Surgical History:   Procedure Laterality Date    TONSILLECTOMY      WISDOM TOOTH EXTRACTION       Social History     Occupational History    Not on file   Tobacco Use    Smoking status: Never    Smokeless tobacco: Never   Vaping Use    Vaping status: Never Used   Substance and Sexual Activity    Alcohol use: No    Drug use: No    Sexual activity: Not on file     Family History   Problem Relation Age of Onset    No Known Problems Mother     No Known Problems Father         No Known Allergies  Prior to Admission medications    Medication Sig Start Date End Date Taking? Authorizing Provider   fluticasone (FLONASE) 50 MCG/ACT nasal spray 2 sprays by Nasal route daily 05/13/15  Yes [provider]   medroxyPROGESTERone (PROVERA) 10 MG tablet Take 1 tablet by mouth daily with food 07/16/22   [provider]   ibuprofen (ADVIL;MOTRIN) 600 MG tablet Take 1 tablet by mouth every 6 hours as needed for Pain    [provider]        Review of Systems:  Pertinent items are noted in the HPI    Objective:     Vitals:    10/30/22  0920   Weight: 106.6 kg (235 lb)   Height: 1.702 m (5\' 7" )       Physical Exam:  See note from 09/30/22     Assessment:     Abnormal uterine bleeding     Plan:     Procedure(s) (LRB):  HYSTEROSCOPY; DILATION & CURETTAGE (N/A)  Discussed the risks of surgery including the risks of bleeding, infection, deep vein thrombosis, and surgical injuries to internal organs including but not limited to the bowels, bladder, rectum, and female reproductive organs. The patient understands the risks;  any and all questions were answered to the patient's satisfaction.    Signed By:  Ardine Bjork, DO     November 02, 2022

## 2022-11-03 ENCOUNTER — Inpatient Hospital Stay: Payer: PRIVATE HEALTH INSURANCE | Attending: Obstetrics & Gynecology

## 2022-11-03 LAB — POC PREGNANCY UR-QUAL: Pregnancy, Urine: NEGATIVE

## 2022-11-03 MED ORDER — LACTATED RINGERS IV SOLN
INTRAVENOUS | Status: DC
Start: 2022-11-03 — End: 2022-11-03
  Administered 2022-11-03: 14:00:00 via INTRAVENOUS

## 2022-11-03 MED ORDER — FENTANYL 0.05 MG/ML SOLN (MIXTURES ONLY)
0.05 | Status: DC | PRN
Start: 2022-11-03 — End: 2022-11-03
  Administered 2022-11-03 (×2): 50 via INTRAVENOUS

## 2022-11-03 MED ORDER — OXYCODONE-ACETAMINOPHEN 5-325 MG PO TABS
5-325 | ORAL_TABLET | Freq: Four times a day (QID) | ORAL | 0 refills | Status: AC | PRN
Start: 2022-11-03 — End: 2022-11-08

## 2022-11-03 MED ORDER — PROPOFOL 200 MG/20ML IV EMUL
200 | INTRAVENOUS | Status: DC | PRN
Start: 2022-11-03 — End: 2022-11-03
  Administered 2022-11-03: 16:00:00 200 via INTRAVENOUS

## 2022-11-03 MED ORDER — NORMAL SALINE FLUSH 0.9 % IV SOLN
0.9 | Freq: Two times a day (BID) | INTRAVENOUS | Status: DC
Start: 2022-11-03 — End: 2022-11-03

## 2022-11-03 MED ORDER — LIDOCAINE HCL (PF) 1 % IJ SOLN
1 | Freq: Once | INTRAMUSCULAR | Status: DC | PRN
Start: 2022-11-03 — End: 2022-11-03

## 2022-11-03 MED ORDER — NORMAL SALINE FLUSH 0.9 % IV SOLN
0.9 | INTRAVENOUS | Status: DC | PRN
Start: 2022-11-03 — End: 2022-11-03

## 2022-11-03 MED ORDER — SODIUM CHLORIDE 0.9 % IV SOLN
0.9 | INTRAVENOUS | Status: DC | PRN
Start: 2022-11-03 — End: 2022-11-03

## 2022-11-03 MED ORDER — ONDANSETRON HCL 4 MG/2ML IJ SOLN
4 | Freq: Once | INTRAMUSCULAR | Status: DC | PRN
Start: 2022-11-03 — End: 2022-11-03

## 2022-11-03 MED ORDER — FENTANYL CITRATE (PF) 100 MCG/2ML IJ SOLN
100 | INTRAMUSCULAR | Status: DC | PRN
Start: 2022-11-03 — End: 2022-11-03

## 2022-11-03 MED ORDER — ACETAMINOPHEN 10 MG/ML IV SOLN
10 | INTRAVENOUS | Status: AC
Start: 2022-11-03 — End: ?

## 2022-11-03 MED ORDER — ACETAMINOPHEN 10 MG/ML IV SOLN
10 | INTRAVENOUS | Status: DC | PRN
Start: 2022-11-03 — End: 2022-11-03
  Administered 2022-11-03: 16:00:00 1000 via INTRAVENOUS

## 2022-11-03 MED ORDER — DEXAMETHASONE SODIUM PHOSPHATE 10 MG/ML IJ SOLN
10 | INTRAMUSCULAR | Status: DC | PRN
Start: 2022-11-03 — End: 2022-11-03
  Administered 2022-11-03: 16:00:00 8 via INTRAVENOUS

## 2022-11-03 MED ORDER — MIDAZOLAM HCL 2 MG/2ML IJ SOLN
2 | INTRAMUSCULAR | Status: AC
Start: 2022-11-03 — End: ?

## 2022-11-03 MED ORDER — SODIUM CHLORIDE (PF) 0.9 % IJ SOLN
0.9 | INTRAMUSCULAR | Status: DC | PRN
Start: 2022-11-03 — End: 2022-11-03

## 2022-11-03 MED ORDER — LIDOCAINE HCL 2 % IJ SOLN
2 | INTRAMUSCULAR | Status: DC | PRN
Start: 2022-11-03 — End: 2022-11-03
  Administered 2022-11-03: 16:00:00 80 via INTRAVENOUS

## 2022-11-03 MED ORDER — LACTATED RINGERS IV SOLN
INTRAVENOUS | Status: DC
Start: 2022-11-03 — End: 2022-11-03

## 2022-11-03 MED ORDER — DROPERIDOL 2.5 MG/ML IJ SOLN
2.5 | Freq: Once | INTRAMUSCULAR | Status: DC | PRN
Start: 2022-11-03 — End: 2022-11-03

## 2022-11-03 MED ORDER — HYDROMORPHONE HCL 1 MG/ML IJ SOLN
1 | INTRAMUSCULAR | Status: DC | PRN
Start: 2022-11-03 — End: 2022-11-03
  Administered 2022-11-03: 17:00:00 0.25 mg via INTRAVENOUS

## 2022-11-03 MED ORDER — MIDAZOLAM HCL 2 MG/2ML IJ SOLN
2 | INTRAMUSCULAR | Status: DC | PRN
  Administered 2022-11-03: 16:00:00 2 via INTRAVENOUS

## 2022-11-03 MED ORDER — FENTANYL CITRATE (PF) 100 MCG/2ML IJ SOLN
100 | INTRAMUSCULAR | Status: AC
Start: 2022-11-03 — End: ?

## 2022-11-03 MED ORDER — ONDANSETRON HCL 4 MG/2ML IJ SOLN
4 | INTRAMUSCULAR | Status: DC | PRN
Start: 2022-11-03 — End: 2022-11-03
  Administered 2022-11-03: 16:00:00 4 via INTRAVENOUS

## 2022-11-03 MED ORDER — OXYCODONE HCL 5 MG PO TABS
5 | Freq: Once | ORAL | Status: DC | PRN
Start: 2022-11-03 — End: 2022-11-03

## 2022-11-03 MED ORDER — KETOROLAC TROMETHAMINE 30 MG/ML IJ SOLN
30 | INTRAMUSCULAR | Status: DC | PRN
Start: 2022-11-03 — End: 2022-11-03
  Administered 2022-11-03: 16:00:00 30 via INTRAVENOUS

## 2022-11-03 MED FILL — HYDROMORPHONE HCL 1 MG/ML IJ SOLN: 1 MG/ML | INTRAMUSCULAR | Qty: 1

## 2022-11-03 MED FILL — LACTATED RINGERS IV SOLN: INTRAVENOUS | Qty: 1000

## 2022-11-03 MED FILL — MIDAZOLAM HCL 2 MG/2ML IJ SOLN: 2 MG/ML | INTRAMUSCULAR | Qty: 2

## 2022-11-03 MED FILL — ACETAMINOPHEN 10 MG/ML IV SOLN: 10 MG/ML | INTRAVENOUS | Qty: 100

## 2022-11-03 MED FILL — FENTANYL CITRATE (PF) 100 MCG/2ML IJ SOLN: 100 MCG/2ML | INTRAMUSCULAR | Qty: 2

## 2022-11-03 NOTE — Progress Notes (Signed)
Pre-Operative Patient Rounding and Family Updates        [x]    Hourly rounding    [x]    Offered toileting    [x]    Call bell with in reach    Update given to:       [x]   Patient                []   Family     Update related to:                        []  Procedure time                [x] Procedure delay                               Other:

## 2022-11-03 NOTE — Anesthesia Pre-Procedure Evaluation (Signed)
Department of Anesthesiology  Preprocedure Note       Name:  Deborah Yoder   Age:  22 y.o.  DOB:  19-Jun-2000                                          MRN:  [id number]         Date:  11/03/2022      Surgeon: Moishe Spice):  Malena Edman, DO    Procedure: Procedure(s):  HYSTEROSCOPY; DILATION & CURETTAGE    Medications prior to admission:   Prior to Admission medications    Medication Sig Start Date End Date Taking? Authorizing Provider   fluticasone (FLONASE) 50 MCG/ACT nasal spray 2 sprays by Nasal route daily 05/13/15  Yes [provider]   medroxyPROGESTERone (PROVERA) 10 MG tablet Take 1 tablet by mouth daily with food 07/16/22  Yes [provider]   ibuprofen (ADVIL;MOTRIN) 600 MG tablet Take 1 tablet by mouth every 6 hours as needed for Pain    [provider]       Current medications:    Current Facility-Administered Medications   Medication Dose Route Frequency Provider Last Rate Last Admin   . lactated ringers IV soln infusion   IntraVENous Continuous Malena Edman, DO 125 mL/hr at 11/03/22 0956 New Bag at 11/03/22 0956   . sodium chloride flush 0.9 % injection 5-40 mL  5-40 mL IntraVENous 2 times per day Jean Rosenthal, Christina M, DO       . sodium chloride flush 0.9 % injection 5-40 mL  5-40 mL IntraVENous PRN Jean Rosenthal, Christina M, DO       . 0.9 % sodium chloride infusion   IntraVENous PRN Jean Rosenthal, Christina M, DO       . lidocaine PF 1 % injection 1 mL  1 mL IntraDERmal Once PRN Lorry Furber, Eliezer Champagne, MD       . lactated ringers IV soln infusion   IntraVENous Continuous Lilliahna Schubring, Eliezer Champagne, MD           Allergies:  No Known Allergies    Problem List:  There is no problem list on file for this patient.      Past Medical History:        Diagnosis Date   . Anemia    . Pericarditis    . Strep throat    . Vitamin D deficiency        Past Surgical History:        Procedure Laterality Date   . TONSILLECTOMY     . WISDOM TOOTH EXTRACTION         Social History:    Social History      Tobacco Use   . Smoking status: Never   . Smokeless tobacco: Never   Substance Use Topics   . Alcohol use: No                                Counseling given: Not Answered      Vital Signs (Current):   Vitals:    10/30/22 0920 11/03/22 0909 11/03/22 0935   BP:  117/81 117/81   Pulse:  92 92   Resp:  18 18   Temp:  99.3 F (37.4 C) 99.3 F (37.4 C)   TempSrc:  Oral Oral   SpO2:  100% 100%  Weight: 106.6 kg (235 lb) 107.2 kg (236 lb 5.3 oz) 107.2 kg (236 lb 5.3 oz)   Height: 1.702 m (5\' 7" ) 1.702 m (5\' 7" ) 1.702 m (5\' 7" )                                              BP Readings from Last 3 Encounters:   11/03/22 117/81   07/26/22 130/71   02/14/22 122/72       NPO Status: Time of last liquid consumption: 0730                        Time of last solid consumption: 2200                        Date of last liquid consumption: 11/03/22                        Date of last solid food consumption: 11/02/22    BMI:   Wt Readings from Last 3 Encounters:   11/03/22 107.2 kg (236 lb 5.3 oz)   07/26/22 110.2 kg (243 lb)   02/14/22 97.5 kg (215 lb)     Body mass index is 37.02 kg/m.    CBC:   Lab Results   Component Value Date/Time    WBC 13.7 07/26/2022 08:47 PM    RBC 3.86 07/26/2022 08:47 PM    HGB 9.9 07/26/2022 08:47 PM    HCT 31.4 07/26/2022 08:47 PM    MCV 81.3 07/26/2022 08:47 PM    RDW 19.2 07/26/2022 08:47 PM    PLT 451 07/26/2022 08:47 PM       CMP:   Lab Results   Component Value Date/Time    NA 138 07/26/2022 08:47 PM    K 3.7 07/26/2022 08:47 PM    CL 107 07/26/2022 08:47 PM    CO2 24 07/26/2022 08:47 PM    BUN 8 07/26/2022 08:47 PM    CREATININE 0.90 07/26/2022 08:47 PM    LABGLOM >90 07/26/2022 08:47 PM    LABGLOM >60 02/14/2022 10:45 AM    GLUCOSE 124 07/26/2022 08:47 PM    CALCIUM 9.2 07/26/2022 08:47 PM    BILITOT 0.1 02/14/2022 10:45 AM    ALKPHOS 86 02/14/2022 10:45 AM    AST 15 02/14/2022 10:45 AM    ALT 23 02/14/2022 10:45 AM       POC Tests: No results for input(s): "POCGLU", "POCNA", "POCK",  "POCCL", "POCBUN", "POCHEMO", "POCHCT" in the last 72 hours.    Coags: No results found for: "PROTIME", "INR", "APTT"    HCG (If Applicable):   Lab Results   Component Value Date    PREGTESTUR negative 11/03/2022    PREGSERUM Negative 07/26/2022        ABGs: No results found for: "PHART", "PO2ART", "PCO2ART", "HCO3ART", "BEART", "O2SATART"     Type & Screen (If Applicable):  No results found for: "LABABO"    Drug/Infectious Status (If Applicable):  No results found for: "HIV", "HEPCAB"    COVID-19 Screening (If Applicable):   Lab Results   Component Value Date/Time    COVID19 Not detected 07/26/2022 08:47 PM           Anesthesia Evaluation     no history of anesthetic complications:   Airway: Mallampati: II  TM distance: >3 FB   Neck ROM: full  Mouth opening: > = 3 FB   Dental: normal exam         Pulmonary:Negative Pulmonary ROS and normal exam                               Cardiovascular:  Exercise tolerance: good (>4 METS)      (-) hypertension, past MI, CAD,  CHF and no hyperlipidemia                Neuro/Psych:   Negative Neuro/Psych ROS              GI/Hepatic/Renal:   (+) morbid obesity     (-) GERD, liver disease and no renal disease       Endo/Other:    (+) no malignancy/cancer.    (-) diabetes mellitus, hypothyroidism, hyperthyroidism, no malignancy/cancer               Abdominal:             Vascular:          Other Findings:       Anesthesia Plan      general     ASA 2       Induction: intravenous.    MIPS: Postoperative opioids intended.  Anesthetic plan and risks discussed with patient.      Plan discussed with CRNA.    Attending anesthesiologist reviewed and agrees with Preprocedure content            Jorge Mandril, MD   11/03/2022

## 2022-11-03 NOTE — Discharge Instructions (Addendum)
Outpatient Surgery Discharge Instructions      General Anesthesia/ Sedation or Local Anesthesia    Do not drive or operate machinery for 24 hours. You are advised to go directly home from the hospital. Also do not drive until cleared by the surgeon and until not taking narcotic pain medication.  Do not consume alcohol, tranquilizers, sleeping medications or any non-prescribed medication for 24 hours.   Do not make important decisions or sign any important papers in the next 24 Hours.  You should have someone with you tonight at home.  Children may appear flushed for several hours after surgery.    Activity     Restrict your activities and rest for a day. Resume light to normal activity tomorrow.    Fluids and Diet    Begin with clear liquids, bouillon, dry toast, soda crackers. If not nauseated, you may go to a regular diet when you desire. Greasy and spicy foods are not advised.  Special diet instructions: advance as tolerated     Medications    Use all medications as directed. When taking pain medications, you may  experience dizziness or drowsiness. Do not drink alcohol or drive when taking pain medications.    You may take a non-prescription "headache" remedy type medication that you normally use, if your surgeon permits, and preferably one that does not contain aspirin.    You may resume your normal daily prescription medication schedule    Your last dose of the following medications was taken at:    Narcotic pain pill medication:  _____________________    Tylenol/Acetaminophen: __12:12pm _______________________    Motrin/Ibuprofen/Toradol/Celebrex: __12:12pm (torodol)______________    Zofran/Ondansetron: ____12:18pm________________________    Prescriptions     Other Prescription instructions:  pick up meds from the pharmacy     Operative Site    Special instructions change peripad as needed        It is important to wash your hands properly. This is the single best way to prevent the spread of infections.  Hand-washing can help keep you from getting sick. It is easy, doesn't cost much, and it works.   Make sure that you and your caregivers follow safe hand-washing routines.       Gynecological Procedures    No tampons, douching, tub baths, or intercourse until: cleared by doctor   D&C and laparoscopic patients may have varying amounts of vaginal drainage for a few days  Laparoscopic patients may develop shoulder pain in first 24 hours from residual gas    Call your doctor or come back to the emergency room if you have increasing vaginal bleeding, pass large blood clots or saturate a peripad in one hour x2 consecutive hours.        Return to Work     You may return to work when cleared by the Careers adviser. Contact the surgeon's office for a work note if needed.     Call your surgeon if you have problems that concern you.  After office hours, you can reach your physician through their answering service.  IF YOU NEED IMMEDIATE ATTENTION, PLEASE GO TO THE NEAREST EMERGENCY DEPARTMENT.  Our [facility] number is [phone]    SPECIFIC COMPLICATIONS TO WATCH FOR:     - Fever over 101 F, by mouth   - Numb, tingling, or cold fingers or toes   - Swelling around operative area   - Pain not relieved by pain medication ordered   - Increased redness, warmth, hardness, around operative  area   - Blood soaked dressing (Small amount of drainage may be normal)   - Increasing and progressive drainage from surgical area or exam site   - Inability to urinate   - Uncontrolled Nausea/Vomiting

## 2022-11-03 NOTE — Anesthesia Post-Procedure Evaluation (Signed)
Department of Anesthesiology  Postprocedure Note    Patient: Deborah Yoder  MRN: [id number]  Birthdate: 05-May-2000  Date of evaluation: 11/03/2022    Procedure Summary       Date: 11/03/22 Room / Location: [facility] MAIN 09 / [facility] MAIN OR    Anesthesia Start: 1159 Anesthesia Stop: 1250    Procedure: HYSTEROSCOPY; DILATION & CURETTAGE (Uterus) Diagnosis:       Menometrorrhagia      (Menometrorrhagia [N92.1])    Surgeons: Malena Edman, DO Responsible Provider: Earlie Server, MD    Anesthesia Type: General ASA Status: 2            Anesthesia Type: General    Aldrete Phase I: Aldrete Score: 10    Aldrete Phase II:      Anesthesia Post Evaluation    Patient location during evaluation: bedside  Patient participation: complete - patient participated  Level of consciousness: awake  Airway patency: patent  Nausea & Vomiting: no nausea and no vomiting  Cardiovascular status: blood pressure returned to baseline  Respiratory status: acceptable  Hydration status: stable  Pain management: adequate    No notable events documented.

## 2022-11-03 NOTE — Progress Notes (Signed)
Leslye Peer mom  is in waiting room. [phone]

## 2022-11-03 NOTE — Op Note (Signed)
Hysteroscopy Note    Date: 11/03/2022    Surgeon: Dr. Sandford Craze    Other OR Staff/Assistants:   Circulator: Konrad Dolores, RN  Surgical Assistant: Lorre Munroe.  Relief Circulator: Albertina Parr, RN  Scrub Person First: Andreas Newport, Sabrina    Pre-Operative Diagnosis: AUB     Post-Operative Diagnosis: Same    Procedure Performed: Hysteroscopy, Dilation and Curettage     Anesthesia: General anesthesia    EBL:  minimal    Findings: normal external genitalia; normal appearing cervix; endometrial cavity benign        Procedure:     Patient was taken to the OR after informed consent had been obtained. Surgery identification was completed with the patient and the OR team. She was then placed under general anesthesia without difficulty. She was placed in dorsal lithotomy position, prepped, and draped in a sterile fashion. The bladder was drained. Patient identification and surgery identification were completed with the surgeon and the OR team.    Attention was turned to the vagina and a bivalve speculum was placed. The anterior lip of the cervix was grasped with a long Allis. The cervix was serially dilated to allow passage of a 5 mm diagnostic hysteroscope. Hysteroscopy was performed with the above noted findings.     D&C was performed until a gritty texture was obtained throughout the uterine cavity. The specimen was sent for permanent pathology.     All instruments were removed from the vagina.    Sponge, lap, needle and instrument counts were correct x 2.  The patient was awakened from anesthesia and transferred to the recovery room in stable condition.       Ardine Bjork, DO  November 03, 2022
